# Patient Record
Sex: Female | Born: 2000 | Race: White | Hispanic: No | Marital: Single | Smoking: Never smoker
Health system: Southern US, Community
[De-identification: ages and names within clinical notes are randomized; demographics above are authoritative.]

## PROBLEM LIST (undated history)

## (undated) DIAGNOSIS — G43909 Migraine, unspecified, not intractable, without status migrainosus: Secondary | ICD-10-CM

---
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
## 2023-06-14 IMAGING — CT CT ABD-PELV W/ CM
2 of 4 series · 16 of 46 positions shown, 18 images · IV contrast (agent unspecified)
Comparison: None.

CLINICAL DATA: Right lower quadrant abdominal pain

EXAM:
CT ABDOMEN AND PELVIS WITH CONTRAST
TECHNIQUE: Multidetector CT imaging of the abdomen and pelvis was performed
using the standard protocol following bolus administration of
intravenous contrast.

[Series 2: abd pel w · axial · 0.74mm/px · z∈[+798,+1228]mm · 13 of 94 slices shown, 15 images]
[im 4/94  soft-tissue]
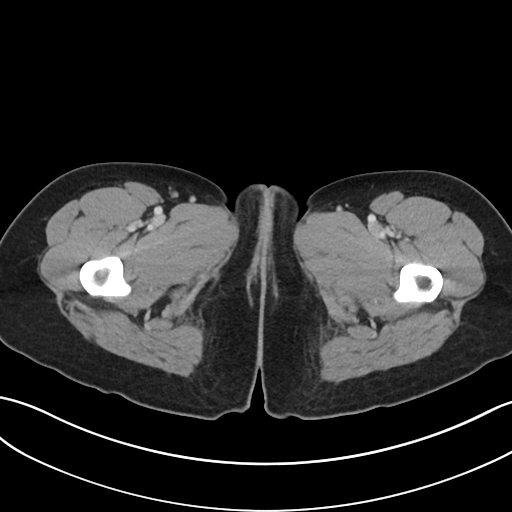
[im 4/94  bone]
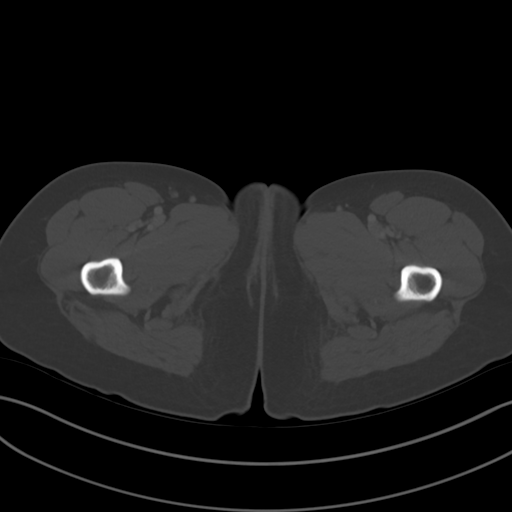
[im 11/94  soft-tissue]
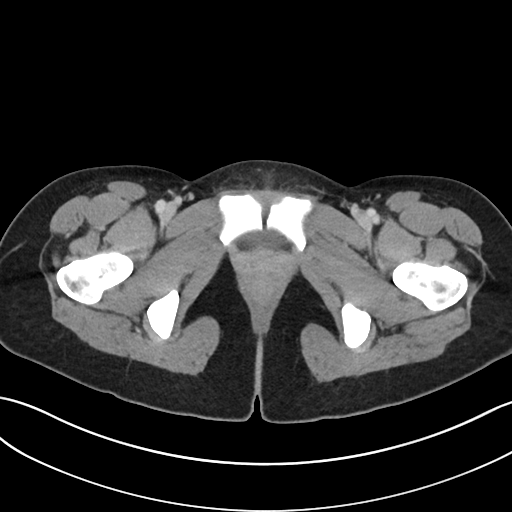
[im 18/94  soft-tissue]
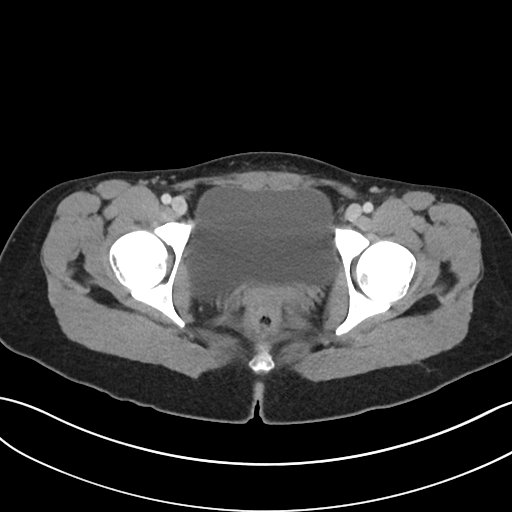
[im 26/94  soft-tissue]
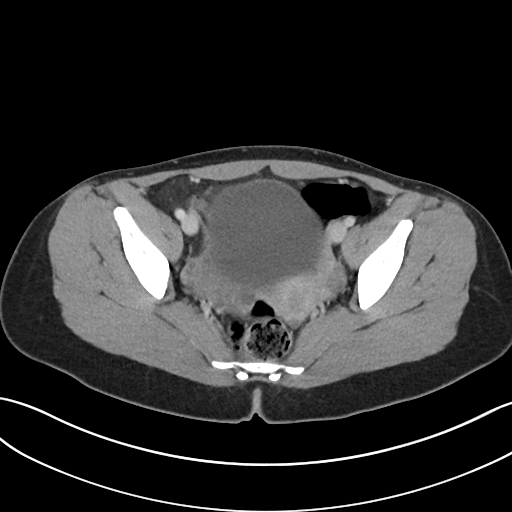
[im 33/94  soft-tissue]
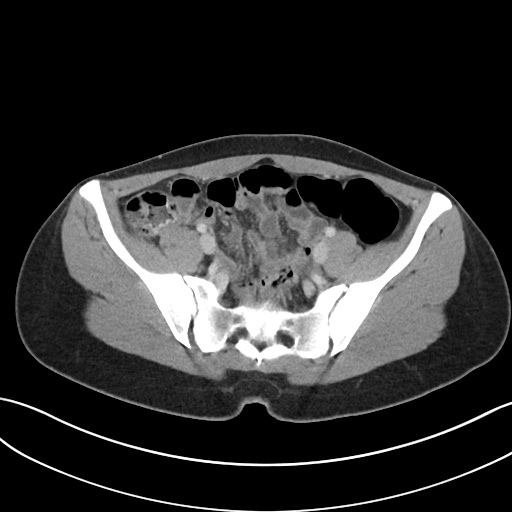
[im 40/94  soft-tissue]
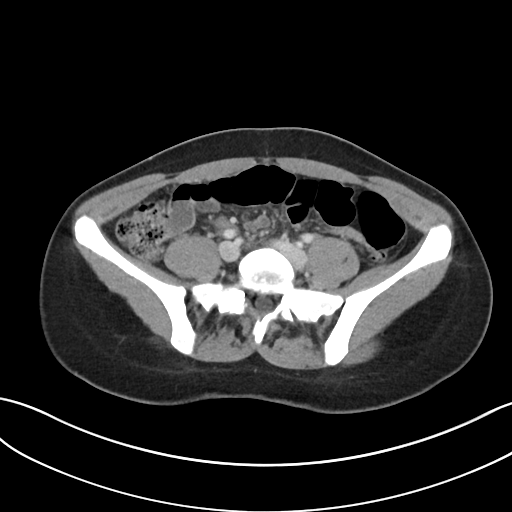
[im 47/94  soft-tissue]
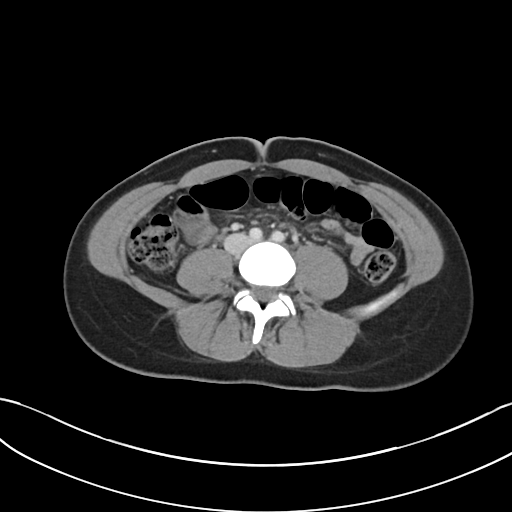
[im 54/94  soft-tissue]
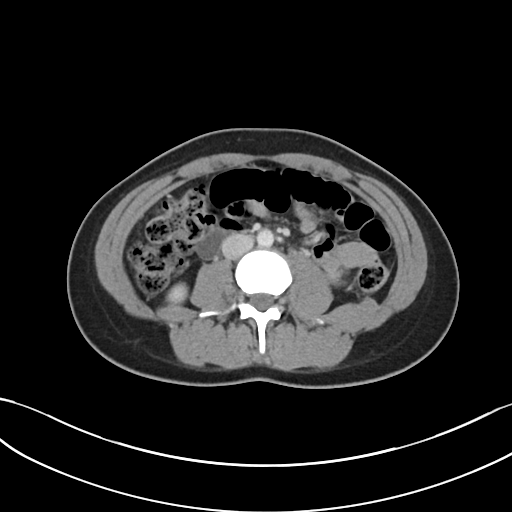
[im 61/94  soft-tissue]
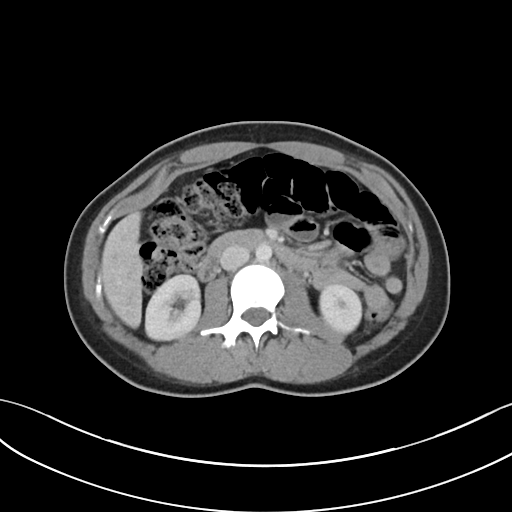
[im 61/94  bone]
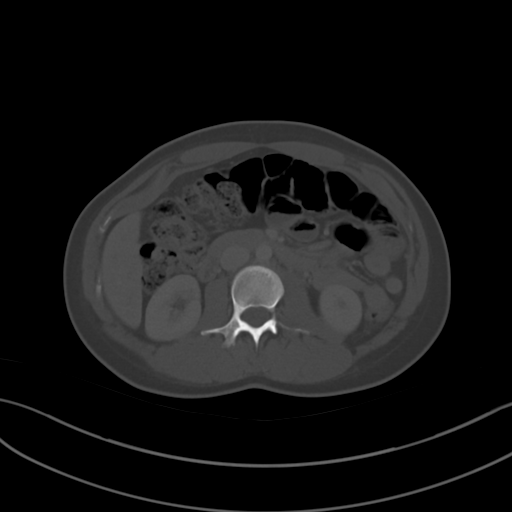
[im 68/94  soft-tissue]
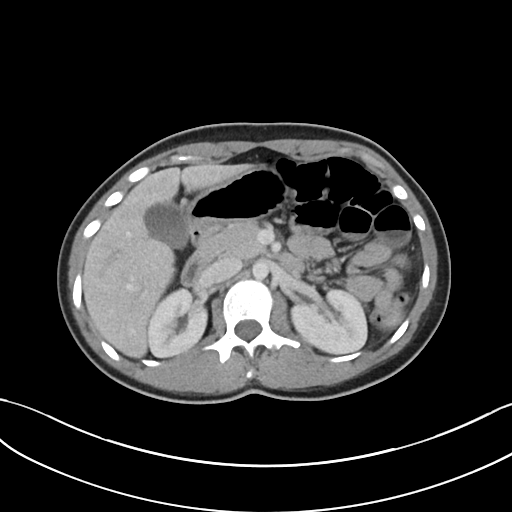
[im 76/94  soft-tissue]
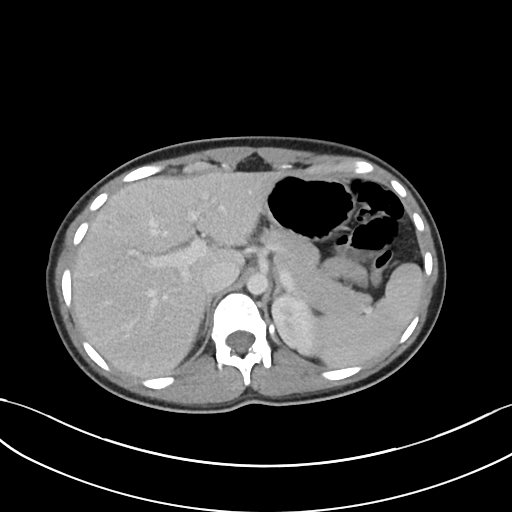
[im 83/94  soft-tissue]
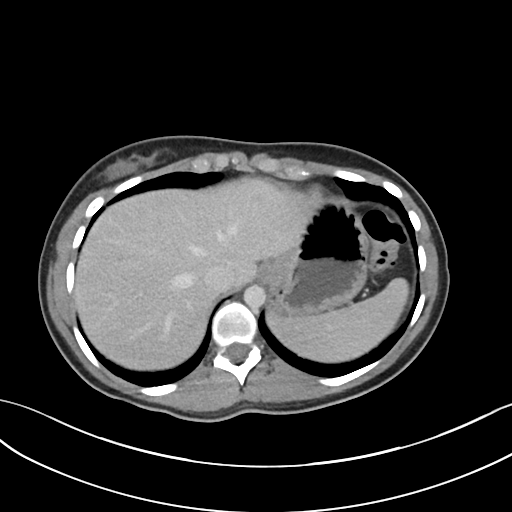
[im 90/94  soft-tissue]
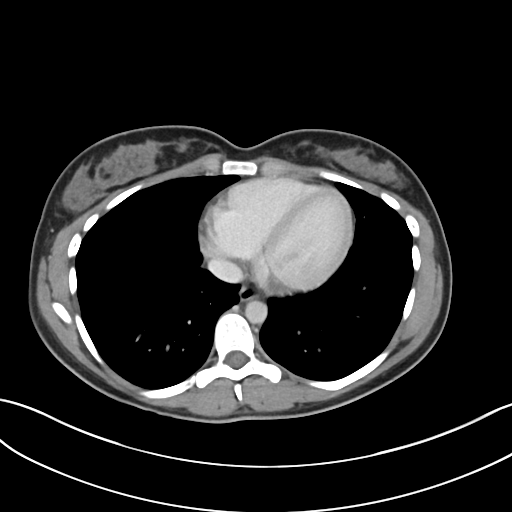

[Series 4: coronal · coronal · 0.80mm/px · 3 of 81 slices shown]
[im 27/81  soft-tissue]
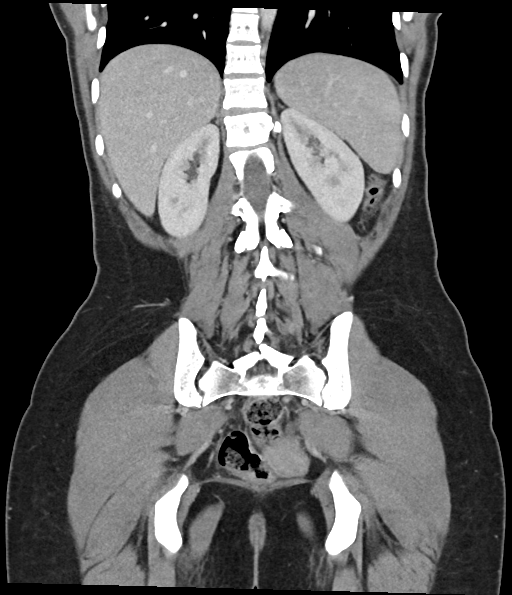
[im 36/81  soft-tissue]
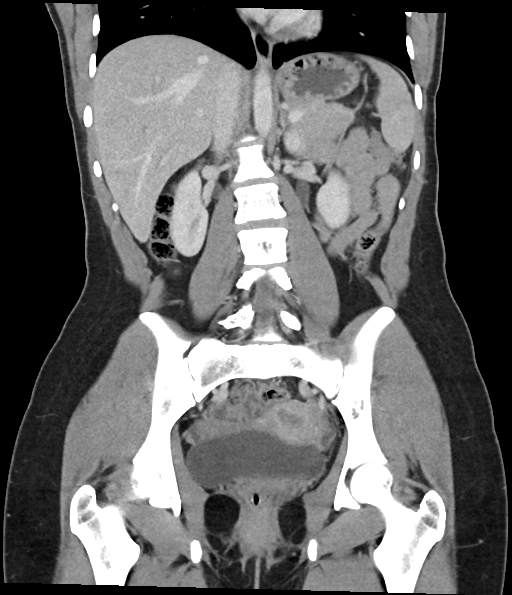
[im 45/81  soft-tissue]
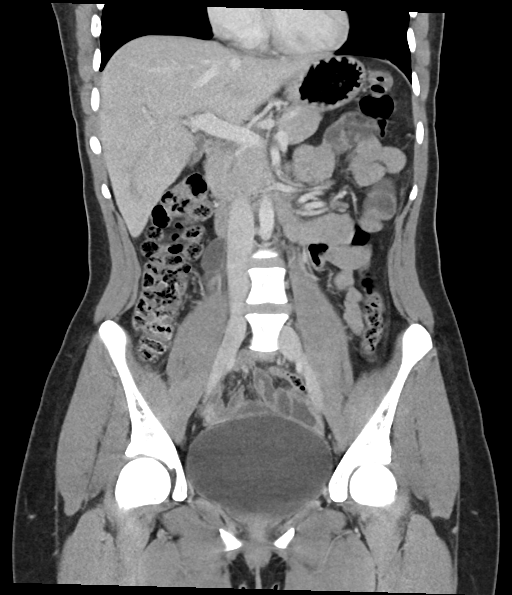

[16 of 46 positions shown; findings below may reference images not displayed]

RADIATION DOSE REDUCTION: This exam was performed according to the
departmental dose-optimization program which includes automated
exposure control, adjustment of the mA and/or kV according to
patient size and/or use of iterative reconstruction technique.

CONTRAST:  80mL OMNIPAQUE IOHEXOL 300 MG/ML  SOLN
FINDINGS: Lower chest: No acute abnormality.

Hepatobiliary: No suspicious liver abnormality identified.
Gallbladder appears normal. No bile duct dilatation.

Pancreas: Unremarkable. No pancreatic ductal dilatation or
surrounding inflammatory changes.

Spleen: Normal in size without focal abnormality.

Adrenals/Urinary Tract: Normal adrenal glands. No nephrolithiasis,
hydronephrosis or mass identified bilaterally. The urinary bladder
is unremarkable.

Stomach/Bowel: Stomach appears normal. What is felt to likely
represent the appendix is identified on image 60/2. Proximally this
measures 6 mm in diameter. The distal portion of the appendix
measures up to 7 mm. No significant periappendiceal fat stranding or
free fluid. No bowel wall thickening, inflammation, or distension.

Vascular/Lymphatic: No significant vascular findings are present. No
enlarged abdominal or pelvic lymph nodes.

Reproductive: Uterus and bilateral adnexa are unremarkable.

Other: No abdominal wall hernia or abnormality. No abdominopelvic
ascites.

Musculoskeletal: No acute or significant osseous findings.
IMPRESSION: 1. Imaging findings equivocal for appendicitis. The appendix is
noted in the right lower quadrant of the abdomen. The distal portion
of the appendix appears mildly increased in thickness measuring 7
mm. No secondary signs of inflammation noted however. Specifically,
there is no periappendiceal fat stranding or free fluid.

## 2023-06-14 IMAGING — US US PELVIS COMPLETE
1 series · 14 of 25 positions shown · non-contrast
Comparison: None.

CLINICAL DATA: Acute right lower quadrant abdominal pain.

EXAM:
TRANSABDOMINAL ULTRASOUND OF PELVIS
TECHNIQUE: Transabdominal ultrasound examination of the pelvis was performed
including evaluation of the uterus, ovaries, adnexal regions, and
pelvic cul-de-sac.

[Series 1: us pelvic complete with transvaginal · 14 of 29 slices shown]
[im 1/29]
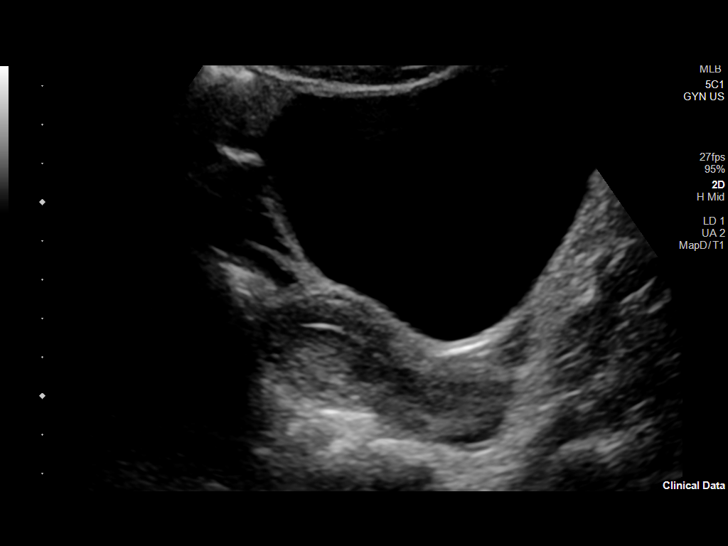
[im 3/29]
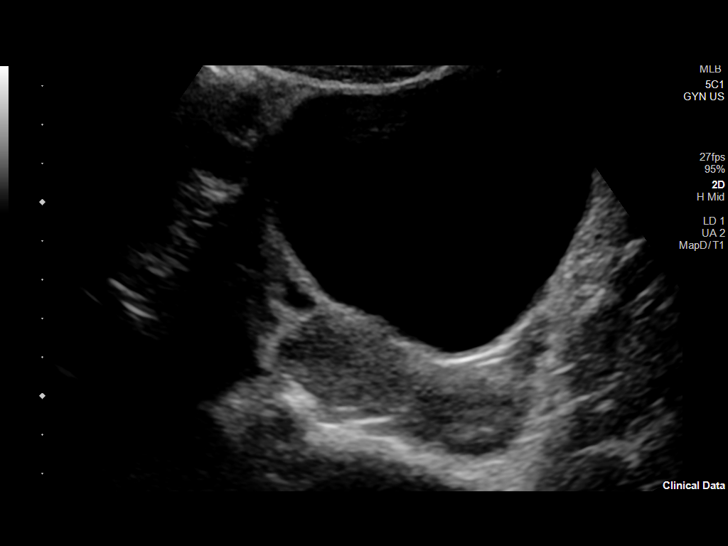
[im 5/29]
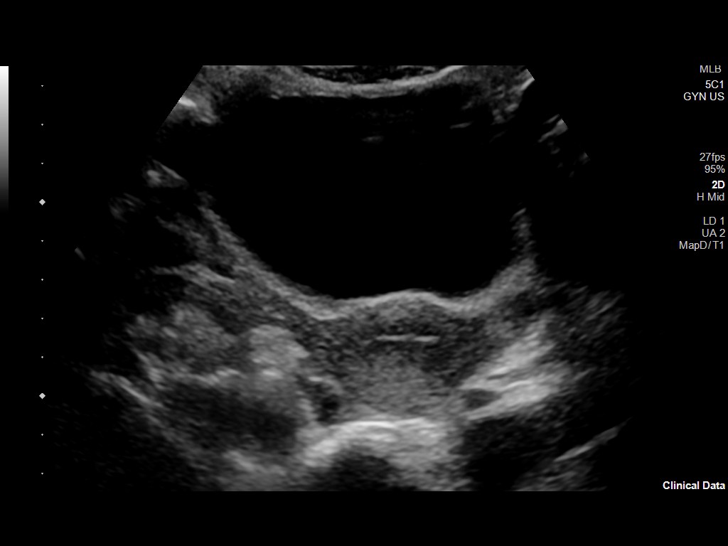
[im 8/29]
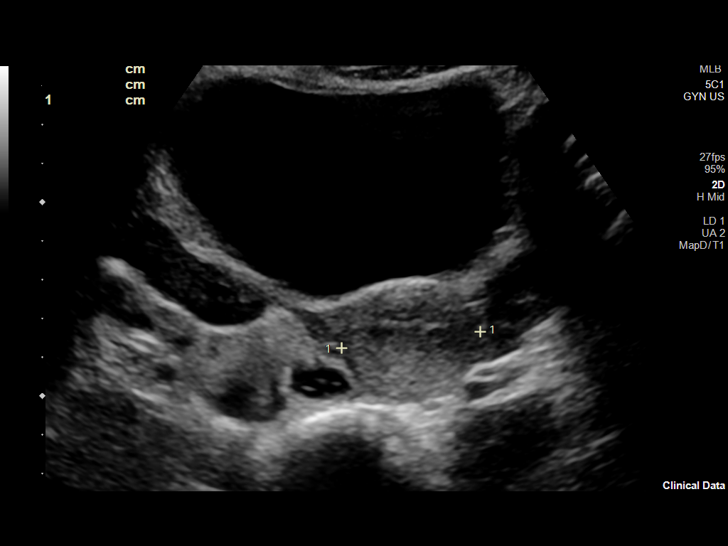
[im 10/29]
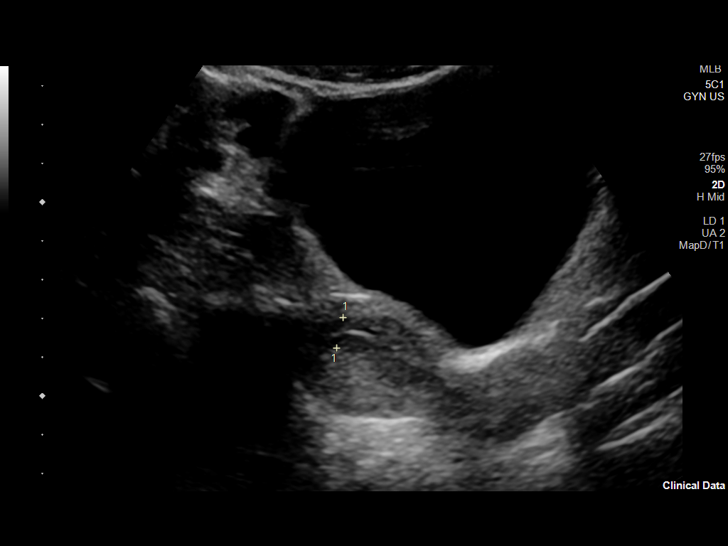
[im 11/29]
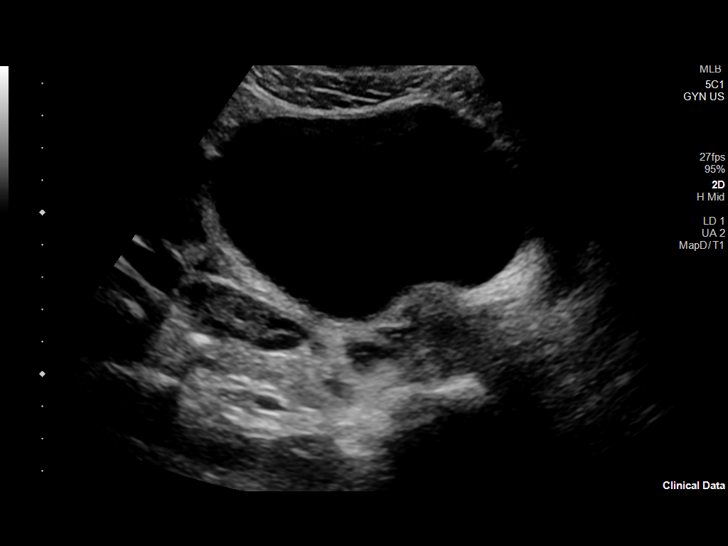
[im 13/29]
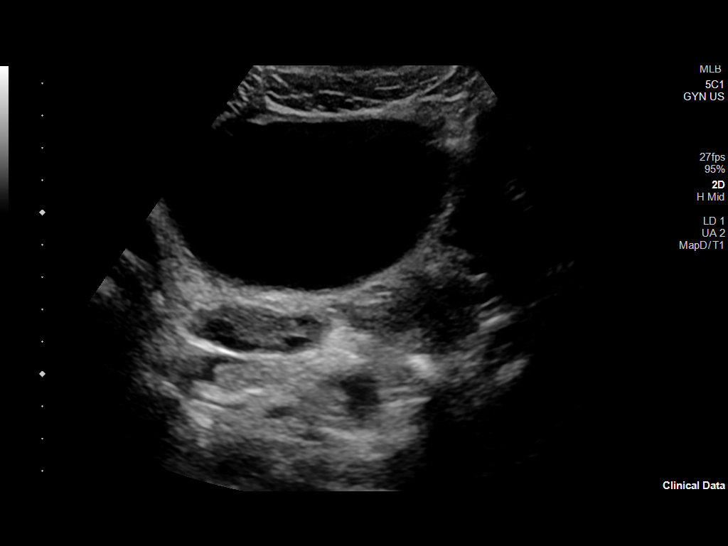
[im 16/29]
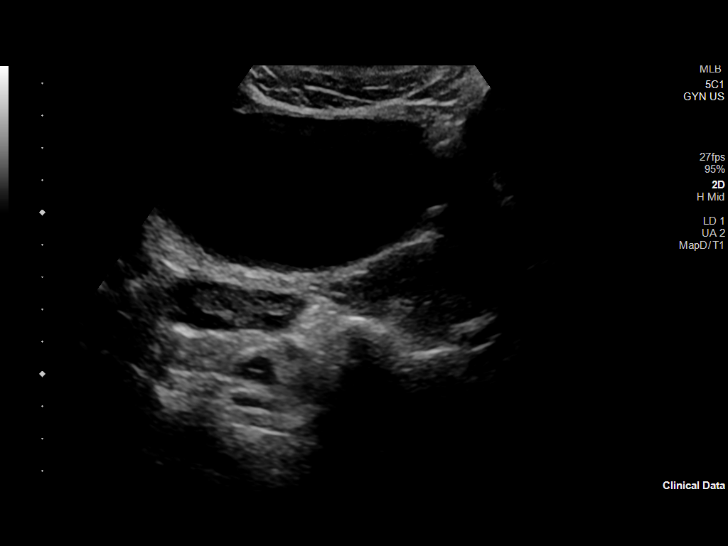
[im 18/29]
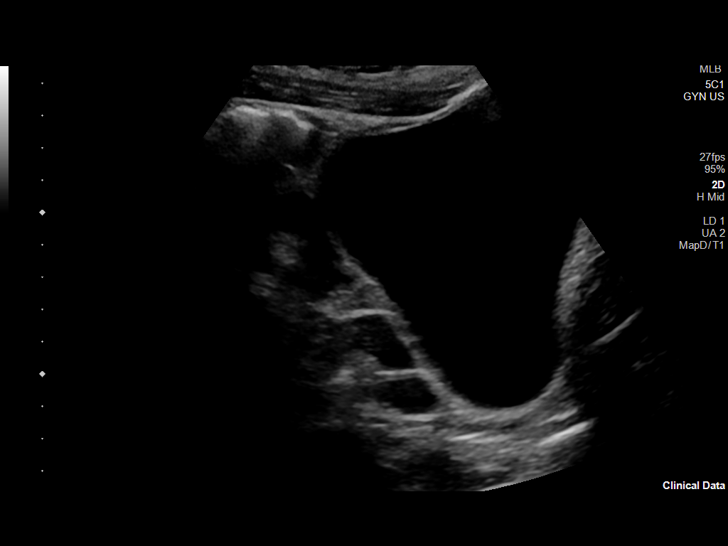
[im 19/29]
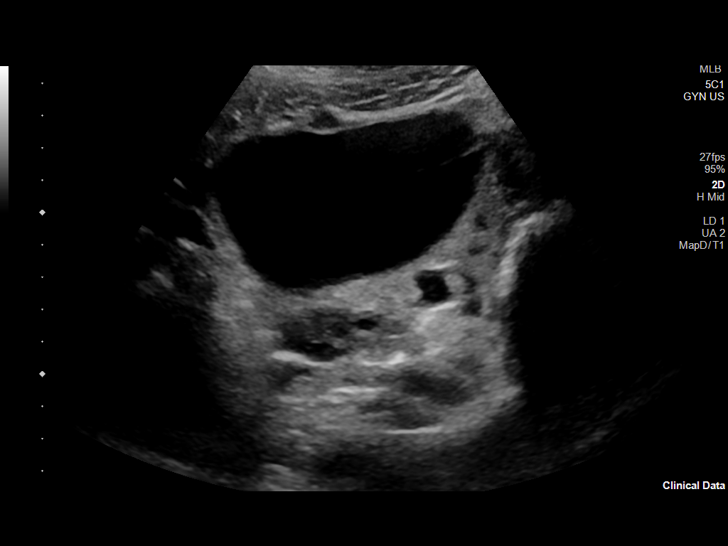
[im 22/29]
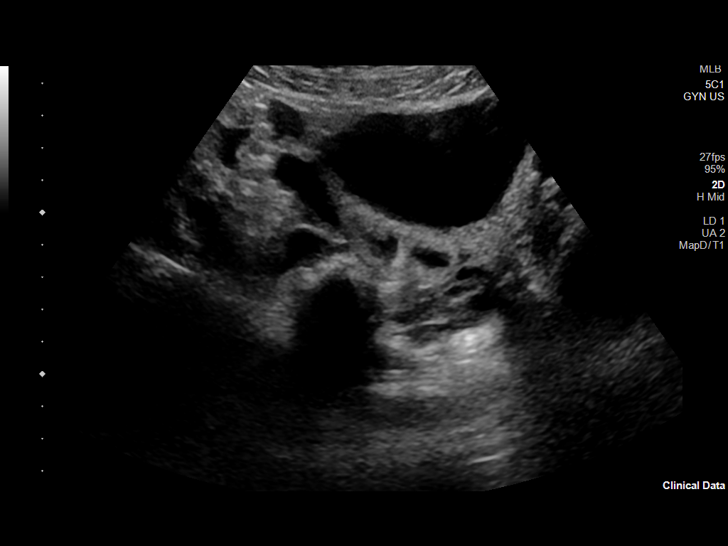
[im 24/29]
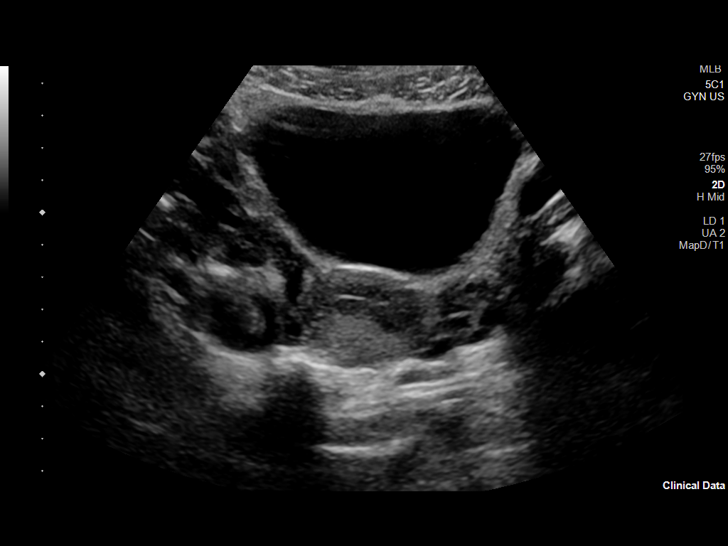
[im 26/29]
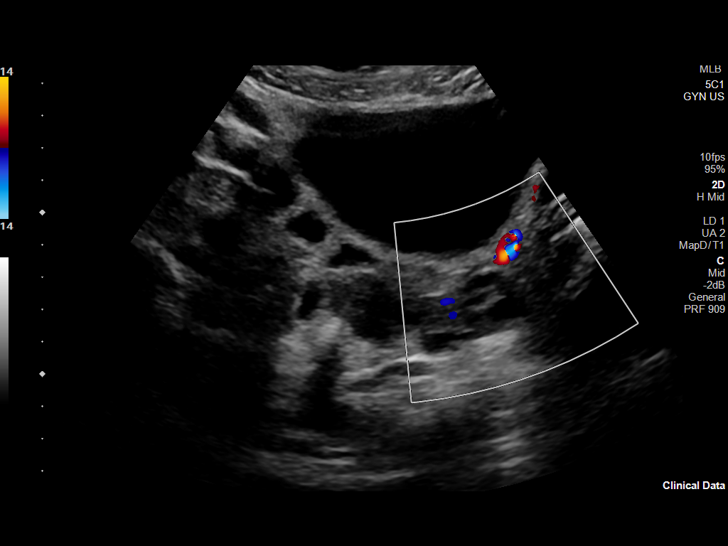
[im 29/29]
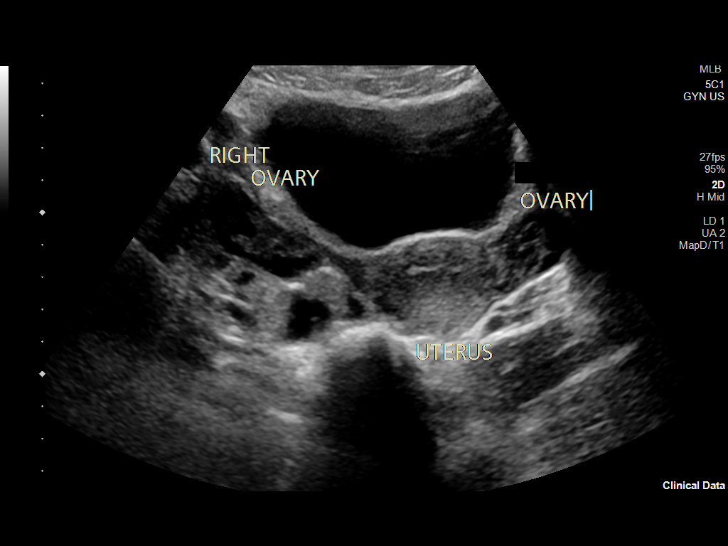

[14 of 25 positions shown; findings below may reference images not displayed]

FINDINGS: Uterus

Measurements: 6.7 x 3.6 x 2.7 cm = volume: 35 mL. No fibroids or
other mass visualized.

Endometrium

Thickness: 8 mm which is within normal limits. No focal abnormality
visualized.

Right ovary

Measurements: 4.2 x 4.3 x 1.6 cm = volume: 14 mL. Normal
appearance/no adnexal mass.

Left ovary

Measurements: 3.3 x 3.1 x 1.9 cm = volume: 10 mL. Normal
appearance/no adnexal mass.

Other findings:  No abnormal free fluid.
IMPRESSION: No definite abnormality seen in the pelvis.
